# Patient Record
Sex: Male | Born: 1995 | Race: White | Hispanic: No | Marital: Single | Smoking: Current every day smoker
Health system: Southern US, Community
[De-identification: ages and names within clinical notes are randomized; demographics above are authoritative.]

## PROBLEM LIST (undated history)

## (undated) DIAGNOSIS — J45909 Unspecified asthma, uncomplicated: Secondary | ICD-10-CM

---
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
## 2017-04-29 ENCOUNTER — Emergency Department (HOSPITAL_COMMUNITY)
Admission: EM | Admit: 2017-04-29 | Discharge: 2017-04-29 | Disposition: A | Discharge status: Home / Self Care | Payer: Self-pay | Attending: Emergency Medicine | Admitting: Emergency Medicine

## 2017-04-29 ENCOUNTER — Encounter (HOSPITAL_COMMUNITY): Payer: Self-pay | Admitting: *Deleted

## 2017-04-29 DIAGNOSIS — R55 Syncope and collapse: Secondary | ICD-10-CM | POA: Insufficient documentation

## 2017-04-29 DIAGNOSIS — F17210 Nicotine dependence, cigarettes, uncomplicated: Secondary | ICD-10-CM | POA: Insufficient documentation

## 2017-04-29 DIAGNOSIS — J45909 Unspecified asthma, uncomplicated: Secondary | ICD-10-CM | POA: Insufficient documentation

## 2017-04-29 HISTORY — DX: Unspecified asthma, uncomplicated: J45.909

## 2017-04-29 LAB — I-STAT TROPONIN, ED: Troponin i, poc: 0 ng/mL (ref 0.00–0.08)

## 2017-04-29 LAB — BASIC METABOLIC PANEL
Anion gap: 10 (ref 5–15)
BUN: 11 mg/dL (ref 6–20)
CO2: 24 mmol/L (ref 22–32)
CREATININE: 0.76 mg/dL (ref 0.61–1.24)
Calcium: 9.4 mg/dL (ref 8.9–10.3)
Chloride: 105 mmol/L (ref 101–111)
GFR calc Af Amer: >60 mL/min (ref >60)
Glucose, Bld: 103 mg/dL — ABNORMAL HIGH (ref 65–99)
Potassium: 4 mmol/L (ref 3.5–5.1)
SODIUM: 139 mmol/L (ref 135–145)

## 2017-04-29 LAB — URINALYSIS, ROUTINE W REFLEX MICROSCOPIC
Bilirubin Urine: NEGATIVE
GLUCOSE, UA: NEGATIVE mg/dL
Hgb urine dipstick: NEGATIVE
KETONES UR: NEGATIVE mg/dL
LEUKOCYTES UA: NEGATIVE
Nitrite: NEGATIVE
PROTEIN: NEGATIVE mg/dL
Specific Gravity, Urine: 1.031 — ABNORMAL HIGH (ref 1.005–1.030)
pH: 5 (ref 5.0–8.0)

## 2017-04-29 LAB — CBC
HCT: 43.4 % (ref 39.0–52.0)
Hemoglobin: 15.3 g/dL (ref 13.0–17.0)
MCH: 31 pg (ref 26.0–34.0)
MCHC: 35.3 g/dL (ref 30.0–36.0)
MCV: 88 fL (ref 78.0–100.0)
PLATELETS: 185 10*3/uL (ref 150–400)
RBC: 4.93 MIL/uL (ref 4.22–5.81)
RDW: 12.5 % (ref 11.5–15.5)
WBC: 5.2 10*3/uL (ref 4.0–10.5)

## 2017-04-29 NOTE — Discharge Instructions (Signed)
Please read and follow all provided instructions.  Your diagnoses today include:  1. Vasovagal syncope     Tests performed today include:  EKG - no abnormal findings  Blood counts and electrolytes - look good  Vital signs. See below for your results today.   Medications prescribed:   None  Take any prescribed medications only as directed.  Home care instructions:  Follow any educational materials contained in this packet.  Maintain good hydration and a good diet at home.  Try to rest today.  Follow-up instructions: Please follow-up with your primary care provider in the next 3 days for further evaluation of your symptoms if not improved.   Return instructions:   Please return to the Emergency Department if you experience worsening symptoms.   Return with persistent chest pain, shortness of breath, additional episodes of passing out or seizure-like activity.  Please return if you have any other emergent concerns.  Additional Information:  Your vital signs today were: BP 103/72 (BP Location: Left Arm)    Pulse 65    Temp 97.9 F (36.6 C) (Oral)    Resp 16    Ht 5\' 6"  (1.676 m)    Wt 59 kg (130 lb)    SpO2 99%    BMI 20.98 kg/m  If your blood pressure (BP) was elevated above 135/85 this visit, please have this repeated by your doctor within one month. --------------

## 2017-04-29 NOTE — ED Provider Notes (Signed)
[facility] EMERGENCY DEPARTMENT Provider Note   CSN: [id number] Arrival date & time: 04/29/17  4098     History   Chief Complaint Chief Complaint  Patient presents with  . Loss of Consciousness    HPI Clarence Hall is a 22 y.o. male.  Patient with no significant past medical history presents today with syncopal episode.  Patient was at work at the [employer] talking to a customer when he began feeling lightheaded.  Just prior to this patient had swallowed a carbonated beverage and he felt pressure from this in his chest.  He sat down on a stool and then syncopized.  Patient was told that he was unconscious for approximately 5 minutes.  He had no shaking that he was told.  He did not bite his tongue or have incontinence.  Patient was a bit confused when he woke up however quickly went back to baseline.  Patient reports that he did not eat anything this morning and typically only eats about once per day.  He states that he has been sleeping well.  He has not had any syncopal episodes in the past.  Currently is asymptomatic and back to his baseline.  No family history of arrhythmias or sudden cardiac death at young age.  Patient has had similar pains in his chest after drinking soda in the past however today was more severe.  Patient denies illicit drug use other than marijuana.  Denies recent palpitations or chest pain prior.  Denies use of cold medications or excessive caffeine. The onset of this condition was acute. The course is resolved.       Past Medical History:  Diagnosis Date  . Asthma     There are no active problems to display for this patient.   History reviewed. No pertinent surgical history.     Home Medications    Prior to Admission medications   Not on File    Family History No family history on file.  Social History Social History   Tobacco Use  . Smoking status: Current Every Day Smoker    Packs/day: 0.50    Types:  Cigarettes  . Smokeless tobacco: Never Used  Substance Use Topics  . Alcohol use: No    Frequency: Never  . Drug use: Yes    Types: Marijuana     Allergies   Patient has no known allergies.   Review of Systems Review of Systems  Constitutional: Negative for fever.  HENT: Negative for rhinorrhea and sore throat.   Eyes: Negative for redness.  Respiratory: Negative for cough and shortness of breath.   Cardiovascular: Negative for chest pain.  Gastrointestinal: Negative for abdominal pain, diarrhea, nausea and vomiting.  Genitourinary: Negative for dysuria.  Musculoskeletal: Negative for myalgias.  Skin: Negative for rash.  Neurological: Positive for syncope. Negative for headaches.     Physical Exam Updated Vital Signs BP 103/72 (BP Location: Left Arm)   Pulse 65   Temp 97.9 F (36.6 C) (Oral)   Resp 16   Ht 5\' 6"  (1.676 m)   Wt 59 kg (130 lb)   SpO2 99%   BMI 20.98 kg/m   Physical Exam  Constitutional: He is oriented to person, place, and time. He appears well-developed and well-nourished.  HENT:  Head: Normocephalic and atraumatic.  Right Ear: Tympanic membrane, external ear and ear canal normal.  Left Ear: Tympanic membrane, external ear and ear canal normal.  Nose: Nose normal.  Mouth/Throat: Uvula is midline, oropharynx  is clear and moist and mucous membranes are normal. Mucous membranes are not dry.  Eyes: Conjunctivae, EOM and lids are normal. Pupils are equal, round, and reactive to light.  Neck: Trachea normal and normal range of motion. Neck supple. Normal carotid pulses and no JVD present. No muscular tenderness present. Carotid bruit is not present. No tracheal deviation present.  Cardiovascular: Normal rate, regular rhythm, S1 normal, S2 normal, normal heart sounds and intact distal pulses. Exam reveals no distant heart sounds and no decreased pulses.  No murmur heard. Pulmonary/Chest: Effort normal and breath sounds normal. No stridor. No respiratory  distress. He has no wheezes. He exhibits no tenderness.  Abdominal: Soft. Normal aorta and bowel sounds are normal. There is no tenderness. There is no rebound and no guarding.  Musculoskeletal: Normal range of motion. He exhibits no edema.       Cervical back: He exhibits normal range of motion, no tenderness and no bony tenderness.  Neurological: He is alert and oriented to person, place, and time. He has normal strength and normal reflexes. No cranial nerve deficit or sensory deficit. He exhibits normal muscle tone. He displays a negative Romberg sign. Coordination and gait normal. GCS eye subscore is 4. GCS verbal subscore is 5. GCS motor subscore is 6.  Skin: Skin is warm and dry. He is not diaphoretic. No cyanosis. No pallor.  Psychiatric: He has a normal mood and affect.  Nursing note and vitals reviewed.    ED Treatments / Results  Labs (all labs ordered are listed, but only abnormal results are displayed) Labs Reviewed  BASIC METABOLIC PANEL - Abnormal; Notable for the following components:      Result Value   Glucose, Bld 103 (*)    All other components within normal limits  URINALYSIS, ROUTINE W REFLEX MICROSCOPIC - Abnormal; Notable for the following components:   Specific Gravity, Urine 1.031 (*)    All other components within normal limits  CBC  I-STAT TROPONIN, ED    EKG  EKG Interpretation  Date/Time:  Friday April 29 2017 09:07:25 EST Ventricular Rate:  75 PR Interval:  132 QRS Duration: 100 QT Interval:  338 QTC Calculation: 377 R Axis:   86 Text Interpretation:  Normal sinus rhythm with sinus arrhythmia Early repolarization Normal ECG No old tracing to compare Likely BER but slight peaking T waves, no reciprocals Confirmed by Shaune PollackIsaacs, Cameron [phone]([id number]) on 04/29/2017 9:15:15 AM       Radiology No results found.  Procedures Procedures (including critical care time)  Medications Ordered in ED Medications - No data to display   Initial Impression /  Assessment and Plan / ED Course  I have reviewed the triage vital signs and the nursing notes.  Pertinent labs & imaging results that were available during my care of the patient were reviewed by me and considered in my medical decision making (see chart for details).     Patient seen and examined. Work-up reviewed.  EKG without prolonged QTC, signs of reentrant tachycardia, signs of Brugada syndrome, signs of hypertrophy.  Patient is not orthostatic.  Urine is concentrated suggesting slight dehydration.  Vital signs reviewed and are as follows: BP 103/72 (BP Location: Left Arm)   Pulse 65   Temp 97.9 F (36.6 C) (Oral)   Resp 16   Ht 5\' 6"  (1.676 m)   Wt 59 kg (130 lb)   SpO2 99%   BMI 20.98 kg/m   Discussed results with patient.  He will hydrate well at  home and rest for today.  Encourage patient to return if he develops chest pain, shortness of breath, additional episodes of syncope.  Encourage PCP follow-up for recheck.  Referrals given.  Patient verbalizes understanding and agrees with plan.  Final Clinical Impressions(s) / ED Diagnoses   Final diagnoses:  Vasovagal syncope   Patient with syncopal episode today.  History sounds most consistent with vasovagal syncope.  Patient was standing, did not eat or drink anything today.  He developed pain in his chest after drinking soda and began to feel lightheaded.  He then syncopized without any seizure-like activity noted.  Patient is otherwise healthy without any concerning family history of cardiac disease or arrhythmia.  Do not feel that further workup is needed at this time.  Patient given return instructions as above and seems reliable to return if any additional events occur.   ED Discharge Orders    None       Renne Crigler, Cordelia Poche 04/29/17 1619    Arby Barrette, MD 04/29/17 [phone]

## 2017-04-29 NOTE — ED Triage Notes (Signed)
Pt in c/o witnessed syncopal episode at work lasting 1 minute, pt EKG WNL, pt denies hitting head, denies SOB, no cardiac hx noted, A&O x4
